# Patient Record
Sex: Female | Born: 1985 | Race: Black or African American | Hispanic: No | Marital: Single | Smoking: Never smoker
Health system: Southern US, Community
[De-identification: ages and names within clinical notes are randomized; demographics above are authoritative.]

## PROBLEM LIST (undated history)

## (undated) DIAGNOSIS — J45909 Unspecified asthma, uncomplicated: Secondary | ICD-10-CM

## (undated) HISTORY — PX: HERNIA REPAIR: SHX51

---

## 2015-09-12 ENCOUNTER — Emergency Department (HOSPITAL_COMMUNITY)
Admission: EM | Admit: 2015-09-12 | Discharge: 2015-09-13 | Disposition: A | Discharge status: Home / Self Care | Payer: BLUE CROSS/BLUE SHIELD | Attending: Emergency Medicine | Admitting: Emergency Medicine

## 2015-09-12 ENCOUNTER — Encounter (HOSPITAL_COMMUNITY): Payer: Self-pay

## 2015-09-12 DIAGNOSIS — R197 Diarrhea, unspecified: Secondary | ICD-10-CM | POA: Insufficient documentation

## 2015-09-12 DIAGNOSIS — Z3202 Encounter for pregnancy test, result negative: Secondary | ICD-10-CM | POA: Diagnosis not present

## 2015-09-12 DIAGNOSIS — R110 Nausea: Secondary | ICD-10-CM | POA: Diagnosis not present

## 2015-09-12 DIAGNOSIS — R143 Flatulence: Secondary | ICD-10-CM | POA: Insufficient documentation

## 2015-09-12 DIAGNOSIS — R1084 Generalized abdominal pain: Secondary | ICD-10-CM | POA: Insufficient documentation

## 2015-09-12 HISTORY — DX: Unspecified asthma, uncomplicated: J45.909

## 2015-09-12 MED ORDER — ONDANSETRON 8 MG PO TBDP
8.0000 mg | ORAL_TABLET | Freq: Once | ORAL | Status: AC
  Administered 2015-09-13: 8 mg via ORAL
  Filled 2015-09-12: qty 1

## 2015-09-12 MED ORDER — DICYCLOMINE HCL 10 MG/ML IM SOLN
20.0000 mg | Freq: Once | INTRAMUSCULAR | Status: AC
  Administered 2015-09-13: 20 mg via INTRAMUSCULAR
  Filled 2015-09-12: qty 2

## 2015-09-12 MED ORDER — KETOROLAC TROMETHAMINE 60 MG/2ML IM SOLN
60.0000 mg | Freq: Once | INTRAMUSCULAR | Status: AC
  Administered 2015-09-13: 60 mg via INTRAMUSCULAR
  Filled 2015-09-12: qty 2

## 2015-09-12 NOTE — ED Notes (Signed)
Patient c/o abdominal pain and diarrhea since this morning.  Patient states that diarrhea has gotten progressively worse throughout the day.  Patient states pain 8/10

## 2015-09-12 NOTE — ED Provider Notes (Signed)
CSN: [id number]     Arrival date & time 09/12/15  2317 History  By signing my name below, I, Debbie Alexander, attest that this documentation has been prepared under the direction and in the presence of Rosmarie Esquibel, MD. Electronically Signed: Octavia Alexander, ED Scribe. 09/12/2015. 11:58 PM.    Chief Complaint  Patient presents with  . Diarrhea  . Abdominal Pain      Patient is a 30 y.o. female presenting with abdominal pain. The history is provided by the patient. No language interpreter was used.  Abdominal Pain Pain location:  Generalized Pain quality: cramping   Pain radiates to:  Does not radiate Pain severity:  Moderate Onset quality:  Sudden Duration:  1 day Timing:  Constant Progression:  Worsening Chronicity:  New Context: sick contacts   Context: not diet changes   Relieved by:  Nothing Worsened by:  Nothing tried Ineffective treatments: Pepto. Associated symptoms: diarrhea and nausea   Associated symptoms: no vomiting   Risk factors: no alcohol abuse    HPI Comments: Debbie Alexander is a 30 y.o. female who presents to the Emergency Department complaining of constant, moderate, gradual worsening upper abdominal cramping with associated diarrhea and nausea onset this morning. She notes having about 15 episodes of diarrhea. Pt has been around a child that is currently enrolled in daycare that had vomiting earlier this week. She reports taking some Pepto to alleviate her symptoms with no apparent relief. Pt denies vomiting.   No past medical history on file. No past surgical history on file. No family history on file. Social History  Substance Use Topics  . Smoking status: Not on file  . Smokeless tobacco: Not on file  . Alcohol Use: Not on file   OB History    No data available     Review of Systems  Gastrointestinal: Positive for nausea, abdominal pain and diarrhea. Negative for vomiting.  All other systems reviewed and are negative.     Allergies  Review of  patient's allergies indicates not on file.  Home Medications   Prior to Admission medications   Not on File   Triage vitals: BP 114/75 mmHg  Pulse 68  Temp(Src) 100 F (37.8 C) (Oral)  Resp 18  SpO2 100%  LMP 09/05/2015 (Exact Date) Physical Exam  Constitutional: She is oriented to person, place, and time. She appears well-developed and well-nourished. No distress.  HENT:  Head: Normocephalic and atraumatic.  Mouth/Throat: Oropharynx is clear and moist.  Moist mucus membranes no exudates  Eyes: EOM are normal. Pupils are equal, round, and reactive to light.  Neck: Normal range of motion. Neck supple.  No Bruey's  Cardiovascular: Normal rate and regular rhythm.   Pulmonary/Chest: Effort normal and breath sounds normal. No stridor. She has no wheezes. She has no rales.  Abdominal: Soft. There is no tenderness. There is no rebound and no guarding.  Gassy throughout  Musculoskeletal: Normal range of motion.  Neurological: She is alert and oriented to person, place, and time.  Skin: Skin is warm and dry.  Psychiatric: She has a normal mood and affect.  Nursing note and vitals reviewed.   ED Course  Procedures  DIAGNOSTIC STUDIES: Oxygen Saturation is 100% on RA, normal by my interpretation.  COORDINATION OF CARE:  11:57 PM Discussed treatment plan with pt at bedside and pt agreed to plan.  Labs Review Labs Reviewed - No data to display  Imaging Review No results found. I have personally reviewed and evaluated these images and  lab results as part of my medical decision-making.   EKG Interpretation None      MDM   Final diagnoses:  None    Medications  dicyclomine (BENTYL) injection 20 mg (20 mg Intramuscular Given 09/13/15 0039)  ketorolac (TORADOL) injection 60 mg (60 mg Intramuscular Given 09/13/15 0041)  ondansetron (ZOFRAN-ODT) disintegrating tablet 8 mg (8 mg Oral Given 09/13/15 0033)   Symptoms improved post medication.  Stable for discharge at this time.   Strict return precautions given    Medication List    TAKE these medications        CULTURELLE PRO-WELL Caps  Take 1 capsule by mouth 2 (two) times daily.     dicyclomine 20 MG tablet  Commonly known as:  BENTYL  Take 1 tablet (20 mg total) by mouth 2 (two) times daily.      ASK your doctor about these medications        bismuth subsalicylate 262 MG chewable tablet  Commonly known as:  PEPTO BISMOL  Chew 524 mg by mouth as needed for diarrhea or loose stools.     loperamide 2 MG capsule  Commonly known as:  IMODIUM  Take 2 mg by mouth as needed for diarrhea or loose stools.       I personally performed the services described in this documentation, which was scribed in my presence. The recorded information has been reviewed and is accurate.     Cy Blamer, MD 09/13/15 [phone]

## 2015-09-13 ENCOUNTER — Emergency Department (HOSPITAL_COMMUNITY): Payer: BLUE CROSS/BLUE SHIELD

## 2015-09-13 LAB — I-STAT BETA HCG BLOOD, ED (MC, WL, AP ONLY): I-stat hCG, quantitative: <5 m[IU]/mL (ref <5)

## 2015-09-13 LAB — URINE MICROSCOPIC-ADD ON: RBC / HPF: NONE SEEN RBC/hpf (ref 0–5)

## 2015-09-13 LAB — URINALYSIS, ROUTINE W REFLEX MICROSCOPIC
BILIRUBIN URINE: NEGATIVE
Glucose, UA: NEGATIVE mg/dL
KETONES UR: NEGATIVE mg/dL
Leukocytes, UA: NEGATIVE
NITRITE: NEGATIVE
Protein, ur: NEGATIVE mg/dL
Specific Gravity, Urine: 1.026 (ref 1.005–1.030)
pH: 5.5 (ref 5.0–8.0)

## 2015-09-13 LAB — I-STAT CHEM 8, ED
BUN: 8 mg/dL (ref 6–20)
CALCIUM ION: 1.12 mmol/L (ref 1.12–1.23)
Chloride: 102 mmol/L (ref 101–111)
Creatinine, Ser: 0.7 mg/dL (ref 0.44–1.00)
Glucose, Bld: 104 mg/dL — ABNORMAL HIGH (ref 65–99)
HEMATOCRIT: 40 % (ref 36.0–46.0)
HEMOGLOBIN: 13.6 g/dL (ref 12.0–15.0)
Potassium: 3.6 mmol/L (ref 3.5–5.1)
SODIUM: 139 mmol/L (ref 135–145)
TCO2: 22 mmol/L (ref 0–100)

## 2015-09-13 MED ORDER — DICYCLOMINE HCL 20 MG PO TABS: 20.0000 mg | ORAL_TABLET | Freq: Two times a day (BID) | ORAL | Status: AC

## 2015-09-13 MED ORDER — CULTURELLE PRO-WELL PO CAPS: 1.0000 | ORAL_CAPSULE | Freq: Two times a day (BID) | ORAL | Status: AC

## 2017-07-07 IMAGING — CR DG ABDOMEN ACUTE W/ 1V CHEST
3 series · 3 of 3 positions shown · non-contrast
Comparison: None.

CLINICAL DATA: Abdominal cramping and diarrhea for 2 weeks. Nausea.

EXAM:
DG ABDOMEN ACUTE W/ 1V CHEST

[w chest pa]
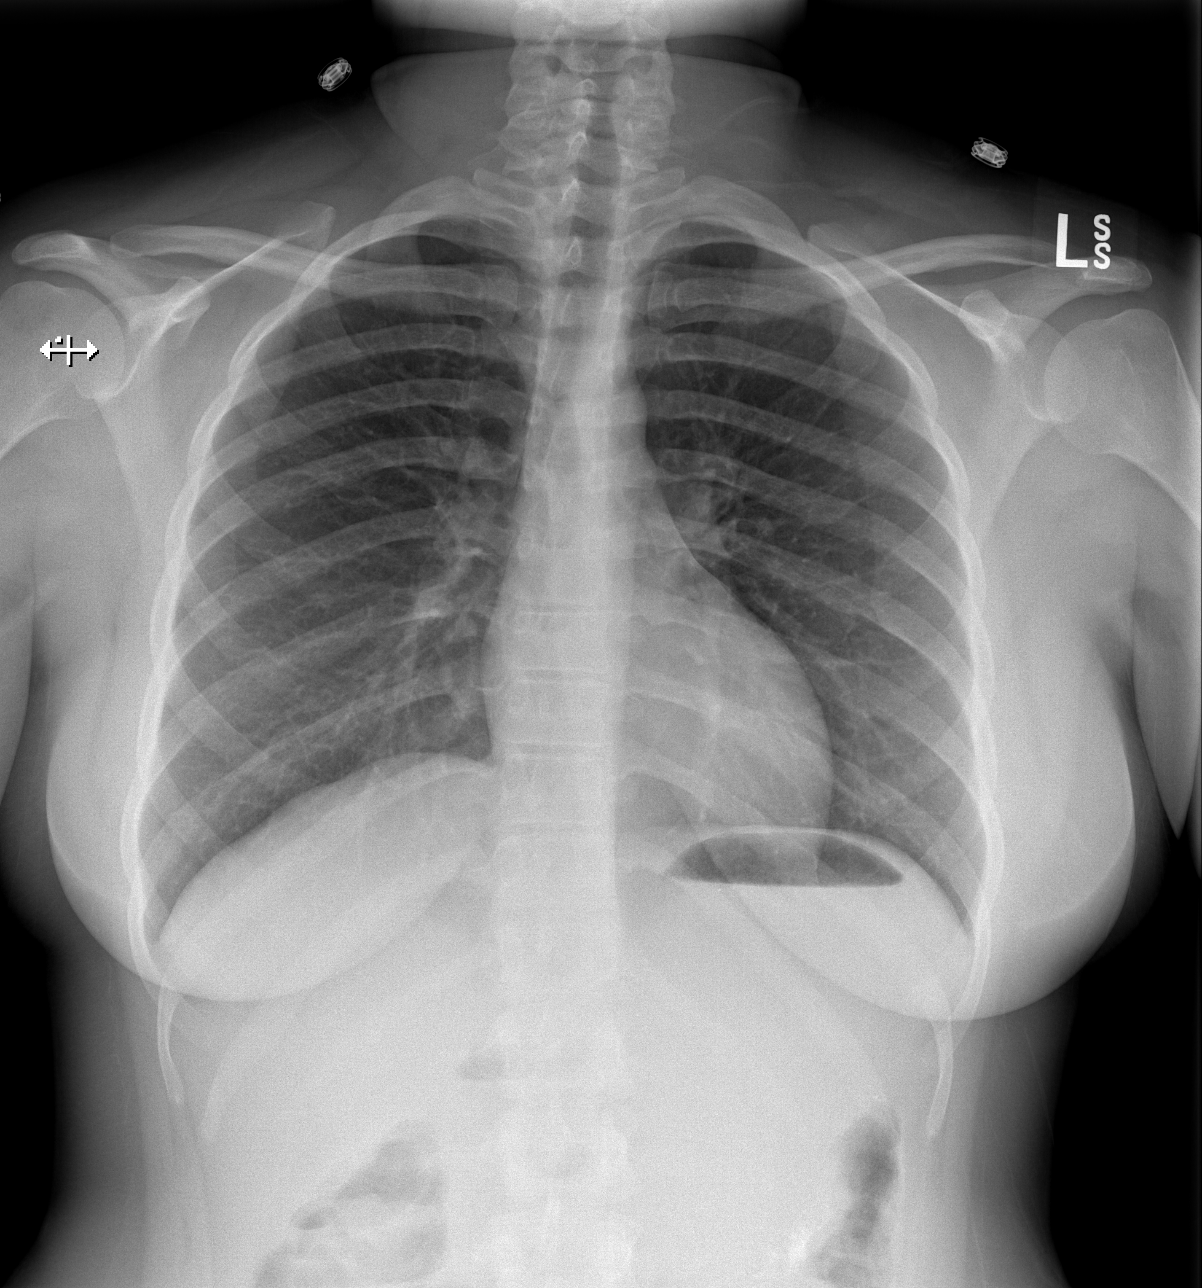

[w abdomen upright]
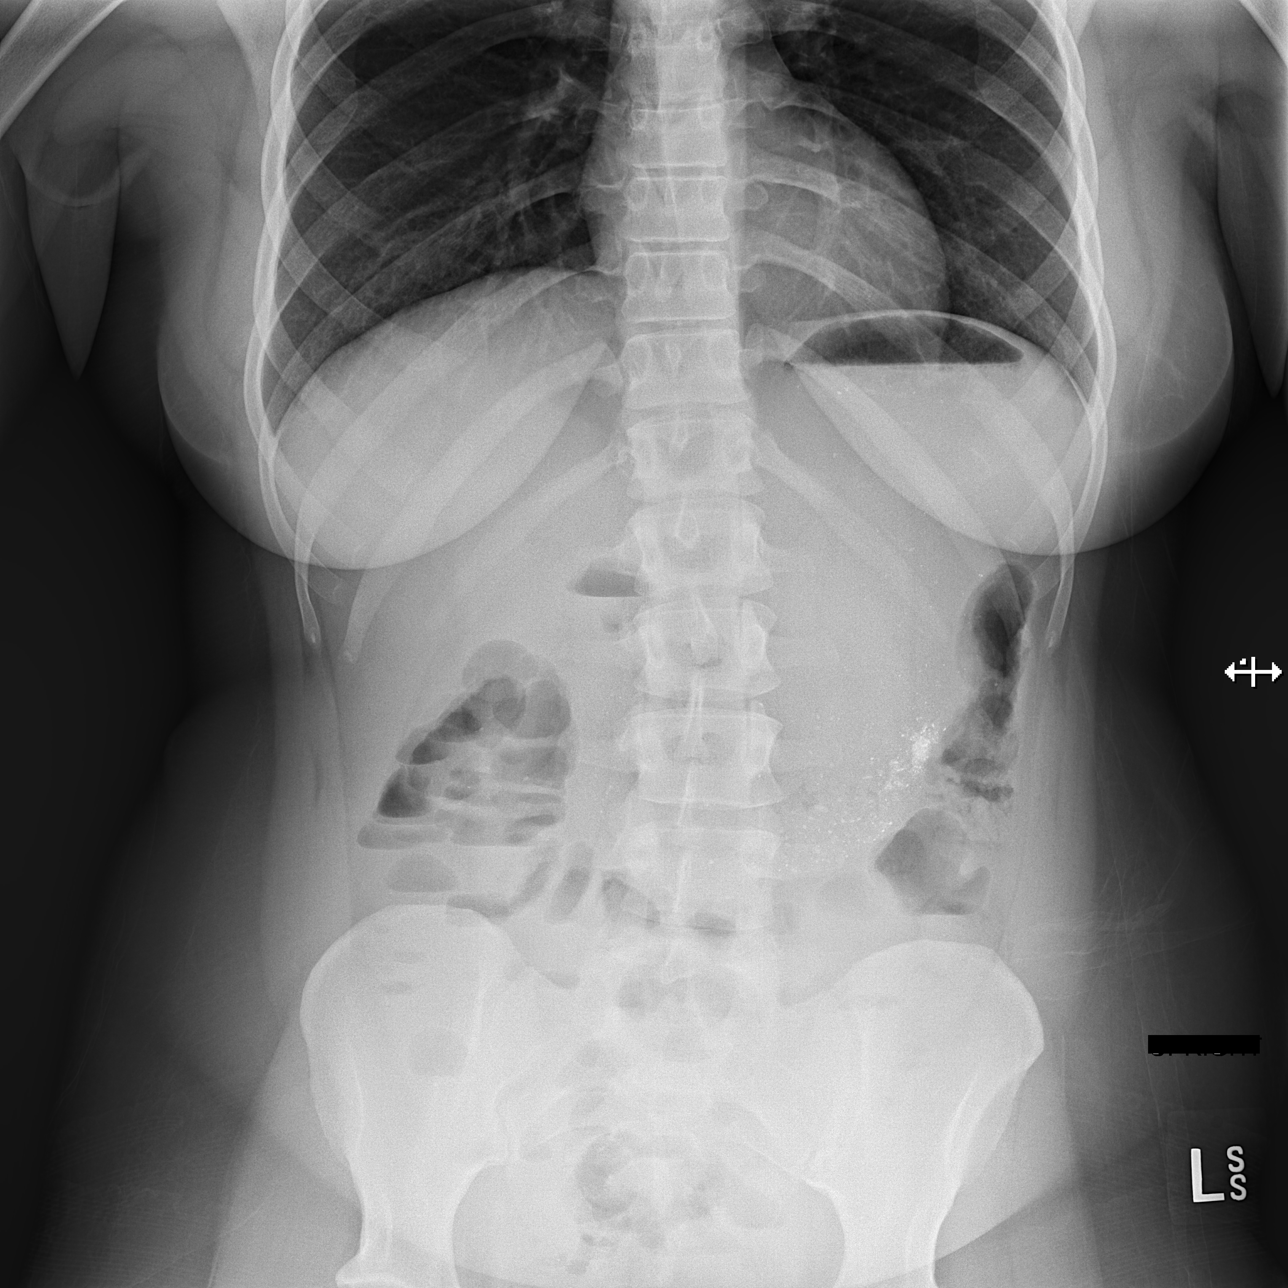

[t abdomen supine]
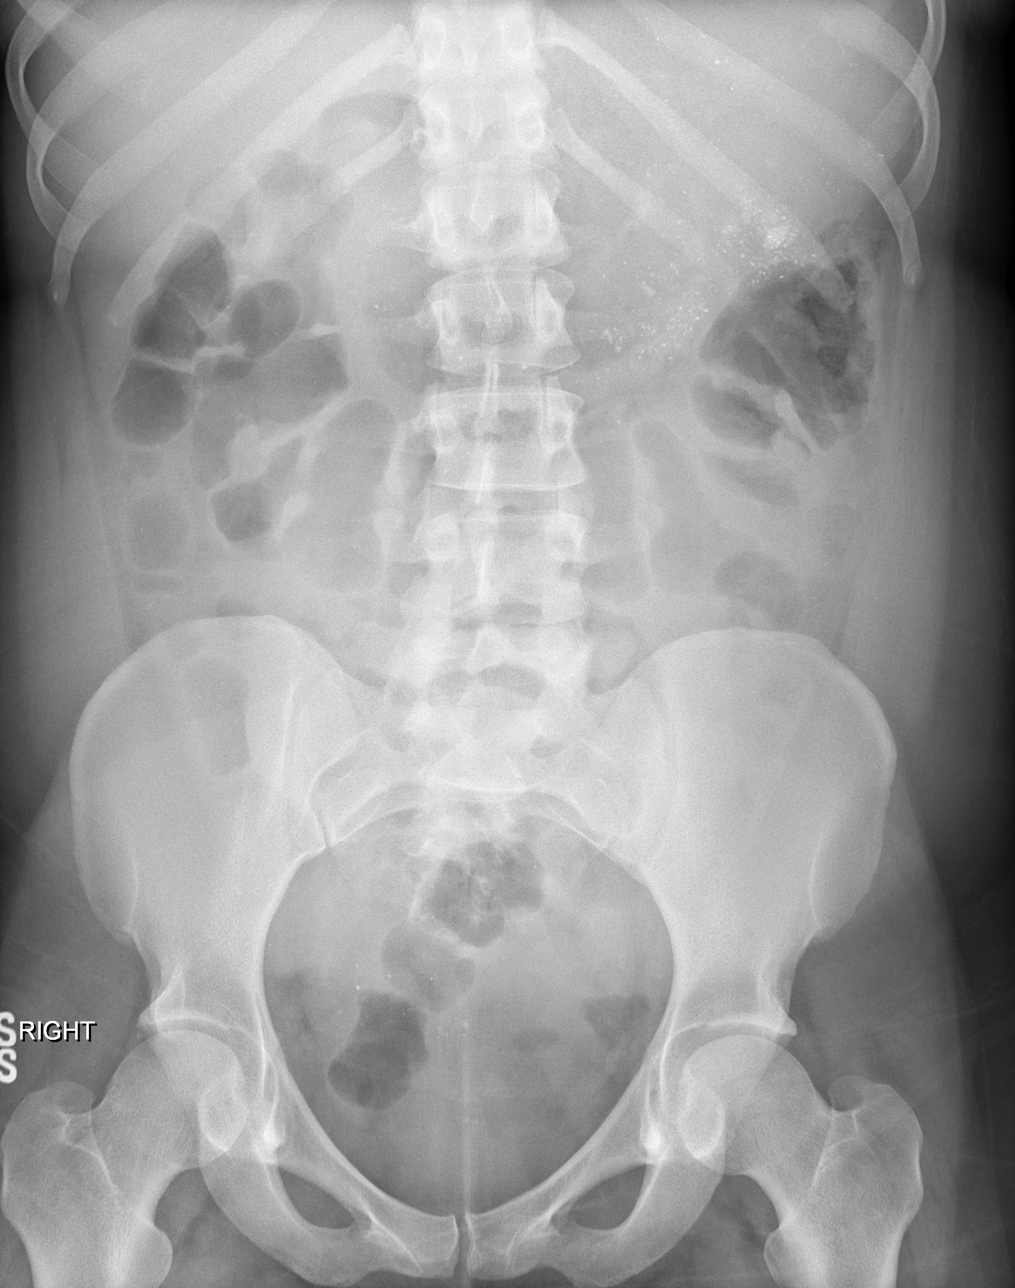

[3 of 3 positions shown; findings below may reference images not displayed]

FINDINGS: The cardiomediastinal contours are normal. The lungs are clear.
There is no free intra-abdominal air. No dilated bowel loops to
suggest obstruction. High-density material in the stomach is likely
related to ingested contents, with air-fluid level. Air-filled
normal caliber colon with air-fluid levels. No radiopaque calculi.
No acute osseous abnormalities are seen.
IMPRESSION: 1. Air-filled colon with air-fluid levels consistent with diarrheal
illness. High-density material within the stomach is likely related
to ingested material. No bowel obstruction.
2. Clear lungs.
